# Patient Record
Sex: Male | Born: 1970
Health system: Southern US, Community
[De-identification: ages and names within clinical notes are randomized; demographics above are authoritative.]

## PROBLEM LIST (undated history)

## (undated) DIAGNOSIS — E785 Hyperlipidemia, unspecified: Secondary | ICD-10-CM

## (undated) DIAGNOSIS — I10 Essential (primary) hypertension: Secondary | ICD-10-CM

## (undated) HISTORY — DX: Hyperlipidemia, unspecified: E78.5

## (undated) HISTORY — PX: KNEE SURGERY: SHX244

## (undated) HISTORY — PX: CORONARY ANGIOPLASTY WITH STENT PLACEMENT: SHX49

---

## 2009-05-07 ENCOUNTER — Emergency Department (HOSPITAL_BASED_OUTPATIENT_CLINIC_OR_DEPARTMENT_OTHER): Admission: EM | Admit: 2009-05-07 | Discharge: 2009-05-07 | Payer: Self-pay | Admitting: Emergency Medicine

## 2009-05-07 ENCOUNTER — Ambulatory Visit: Payer: Self-pay | Admitting: Diagnostic Radiology

## 2016-08-03 ENCOUNTER — Emergency Department (INDEPENDENT_AMBULATORY_CARE_PROVIDER_SITE_OTHER): Payer: BLUE CROSS/BLUE SHIELD

## 2016-08-03 ENCOUNTER — Emergency Department
Admission: EM | Admit: 2016-08-03 | Discharge: 2016-08-03 | Disposition: A | Discharge status: Home / Self Care | Payer: BLUE CROSS/BLUE SHIELD | Source: Home / Self Care | Attending: Family Medicine | Admitting: Family Medicine

## 2016-08-03 ENCOUNTER — Encounter: Payer: Self-pay | Admitting: Emergency Medicine

## 2016-08-03 DIAGNOSIS — M2341 Loose body in knee, right knee: Secondary | ICD-10-CM

## 2016-08-03 DIAGNOSIS — S83411A Sprain of medial collateral ligament of right knee, initial encounter: Secondary | ICD-10-CM

## 2016-08-03 DIAGNOSIS — M1711 Unilateral primary osteoarthritis, right knee: Secondary | ICD-10-CM | POA: Diagnosis not present

## 2016-08-03 HISTORY — DX: Essential (primary) hypertension: I10

## 2016-08-03 MED ORDER — MELOXICAM 15 MG PO TABS: 15.0000 mg | ORAL_TABLET | Freq: Every day | ORAL | 0 refills | Status: DC

## 2016-08-03 NOTE — ED Provider Notes (Signed)
[facility]    CSN: [id number] Arrival date & time: 08/03/16  0815     History   Chief Complaint Chief Complaint  Patient presents with  . Knee Pain    HPI George Poole is a 45 y.o. male.   Patient was walking yesterday when he heard a popping noise in his right knee followed by pain that has persisted.  He began wearing a hinged knee brace that he already had.  He has a past history of severe knee strain when he was 16, and arthroscopic ACL and meniscus repair about 5 years ago.  He also reports that he had a metallic foreign body in the medial aspect of his right knee recently    The history is provided by the patient.  Knee Pain  Location:  Knee Time since incident:  1 day Injury: no   Knee location:  R knee Pain details:    Quality:  Aching   Radiates to:  Does not radiate   Severity:  Moderate   Onset quality:  Sudden   Duration:  1 day   Timing:  Constant   Progression:  Unchanged Chronicity:  New Foreign body present:  No foreign bodies Prior injury to area:  Yes Relieved by:  Nothing Worsened by:  Bearing weight Ineffective treatments: knee brace. Associated symptoms: decreased ROM, stiffness and swelling   Associated symptoms: no back pain, no fever, no muscle weakness, no numbness and no tingling     Past Medical History:  Diagnosis Date  . Hypertension     There are no active problems to display for this patient.   Past Surgical History:  Procedure Laterality Date  . KNEE SURGERY         Home Medications    Prior to Admission medications   Medication Sig Start Date End Date Taking? Authorizing Provider  lisinopril (PRINIVIL,ZESTRIL) 10 MG tablet Take 10 mg by mouth daily.   Yes Historical Provider, MD  pseudoephedrine-acetaminophen (TYLENOL SINUS) 30-500 MG TABS tablet Take 1 tablet by mouth every 4 (four) hours as needed.   Yes Historical Provider, MD  simvastatin (ZOCOR) 10 MG tablet Take 10 mg by mouth daily.   Yes  Historical Provider, MD  meloxicam (MOBIC) 15 MG tablet Take 1 tablet (15 mg total) by mouth daily. Take with food each morning 08/03/16   Lattie Haw, MD    Family History History reviewed. No pertinent family history.  Social History Social History  Substance Use Topics  . Smoking status: Never Smoker  . Smokeless tobacco: Never Used  . Alcohol use No     Allergies   Patient has no allergy information on record.   Review of Systems Review of Systems  Constitutional: Negative for fever.  Musculoskeletal: Positive for stiffness. Negative for back pain.  All other systems reviewed and are negative.    Physical Exam Triage Vital Signs ED Triage Vitals  Enc Vitals Group     BP 08/03/16 0853 147/90     Pulse Rate 08/03/16 0853 64     Resp --      Temp 08/03/16 0853 98.1 F (36.7 C)     Temp Source 08/03/16 0853 Oral     SpO2 08/03/16 0853 96 %     Weight 08/03/16 0854 228 lb (103.4 kg)     Height --      Head Circumference --      Peak Flow --      Pain Score 08/03/16 0856 8  Pain Loc --      Pain Edu? --      Excl. in GC? --    No data found.   Updated Vital Signs BP 147/90 (BP Location: Right Arm)   Pulse 64   Temp 98.1 F (36.7 C) (Oral)   Wt 228 lb (103.4 kg)   SpO2 96%   Visual Acuity Right Eye Distance:   Left Eye Distance:   Bilateral Distance:    Right Eye Near:   Left Eye Near:    Bilateral Near:     Physical Exam  Constitutional: He appears well-developed and well-nourished. No distress.  HENT:  Head: Atraumatic.  Eyes: Pupils are equal, round, and reactive to light.  Neck: Normal range of motion.  Cardiovascular: Normal rate.   Pulmonary/Chest: Effort normal.  Musculoskeletal:       Right knee: He exhibits decreased range of motion. He exhibits no swelling, no effusion, no ecchymosis, no deformity, no laceration, no erythema and no LCL laxity. Tenderness found. Medial joint line tenderness noted.       Legs: Right knee has  full range of motion.  Vague tenderness to palpation medial joint line.  Knee stable.  Negative McMurray, grind test, and Thessaly test.  Neurological: He is alert.  Skin: Skin is warm and dry.  Nursing note and vitals reviewed.    UC Treatments / Results  Labs (all labs ordered are listed, but only abnormal results are displayed) Labs Reviewed - No data to display  EKG  EKG Interpretation None       Radiology Dg Knee Complete 4 Views Right  Result Date: 08/03/2016 CLINICAL DATA:  45 year old male with right knee pain localizing to the medial compartment. Patient has a history of prior ACL surgery as well as a known metallic fragment in the soft tissues. EXAM: RIGHT KNEE - COMPLETE 4+ VIEW COMPARISON:  None. FINDINGS: There is no evidence of acute fracture, malalignment or joint effusion. Surgical changes consistent with prior ACL repair are noted. A 1.2 cm metallic radiopaque foreign body is noted within the superficial subcutaneous fat of the medial distal thigh. The foreign body is approximately 6 mm deep to the skin surface. Tricompartmental osteoarthritis is present most significant in the medial compartment were there is joint space narrowing, subchondral sclerosis and osteophyte formation. Osteophyte formation is also present in the lateral and patellofemoral compartments. Normal bony mineralization. Soft tissues are otherwise unremarkable. IMPRESSION: 1. No acute fracture, malalignment or knee joint effusion. 2. Tricompartmental degenerative osteoarthritis most significant in the medial compartment. 3. Surgical changes of prior ACL reconstruction. 4. 1.2 cm metallic foreign body in the superficial subcutaneous fat of the medial distal thigh. Electronically Signed   By: Malachy MoanHeath  McCullough M.D.   On: 08/03/2016 09:33    Procedures Procedures (including critical care time)  Medications Ordered in UC Medications - No data to display   Initial Impression / Assessment and Plan / UC  Course  I have reviewed the triage vital signs and the nursing notes.  Pertinent labs & imaging results that were available during my care of the patient were reviewed by me and considered in my medical decision making (see chart for details).  Clinical Course   Begin Mobic 15mg  daily. Continue knee brace daytime.  Apply ice pack for 20 to 30 minutes, 3 to 4 times daily  Continue until pain and swelling decrease. Begin knee range of motion and stretching exercises as tolerated. Followup with Dr. Rodney Langtonhomas Thekkekandam or Dr. Clementeen GrahamEvan Corey ([facility]) if not improving about two weeks.      Final Clinical Impressions(s) / UC Diagnoses   Final diagnoses:  Sprain of medial collateral ligament of right knee, initial encounter    New Prescriptions New Prescriptions   MELOXICAM (MOBIC) 15 MG TABLET    Take 1 tablet (15 mg total) by mouth daily. Take with food each morning     Lattie HawStephen A Beese, MD 08/19/16 1013

## 2016-08-03 NOTE — Discharge Instructions (Signed)
Continue knee brace daytime.  Apply ice pack for 20 to 30 minutes, 3 to 4 times daily  Continue until pain and swelling decrease. Begin knee range of motion and stretching exercises as tolerated.

## 2016-08-03 NOTE — ED Triage Notes (Signed)
Pt c/o right knee pain. States he heard a popping noise yesterday while walking. He had ACL repair in 2011 and states he has reoccurring knee pain.

## 2018-05-12 DIAGNOSIS — M7989 Other specified soft tissue disorders: Secondary | ICD-10-CM | POA: Diagnosis not present

## 2018-05-12 DIAGNOSIS — E7800 Pure hypercholesterolemia, unspecified: Secondary | ICD-10-CM | POA: Diagnosis not present

## 2018-05-12 DIAGNOSIS — I10 Essential (primary) hypertension: Secondary | ICD-10-CM | POA: Diagnosis not present

## 2018-05-12 DIAGNOSIS — M79604 Pain in right leg: Secondary | ICD-10-CM | POA: Diagnosis not present

## 2018-05-12 DIAGNOSIS — Z125 Encounter for screening for malignant neoplasm of prostate: Secondary | ICD-10-CM | POA: Diagnosis not present

## 2018-05-15 ENCOUNTER — Ambulatory Visit: Payer: BLUE CROSS/BLUE SHIELD | Admitting: Sports Medicine

## 2018-05-15 ENCOUNTER — Encounter: Payer: Self-pay | Admitting: Sports Medicine

## 2018-05-15 DIAGNOSIS — M1711 Unilateral primary osteoarthritis, right knee: Secondary | ICD-10-CM | POA: Diagnosis not present

## 2018-05-15 DIAGNOSIS — M79661 Pain in right lower leg: Secondary | ICD-10-CM | POA: Insufficient documentation

## 2018-05-15 MED ORDER — MELOXICAM 15 MG PO TABS: ORAL_TABLET | ORAL | 3 refills | Status: DC

## 2018-05-15 NOTE — Assessment & Plan Note (Signed)
Status post ACL reconstruction with tricompartment osteoarthritis as expected. Likely some degenerative meniscal tearing as well. Meloxicam, knee rehab exercises. Return in 1 month, injection if no better.

## 2018-05-15 NOTE — Progress Notes (Signed)
Subjective:    I'm seeing this patient as a consultation for: Dr. Roxy Manns Radiontchenko  CC: Right leg pain  HPI: This is a pleasant 47 year old male, he is post ACL reconstruction on the right years ago.  More recently when getting out of a truck he hyperextended his right knee, felt pain in the calf, he was seen by his PCP, appropriately a DVT ultrasound was ordered and was negative.  He also had x-rays done that showed tricompartmental osteoarthritis.  He is referred to me for further evaluation and definitive treatment.  Pain is moderate, persistent, localized at the musculotendinous junction of the medial head of the gastrocnemius, minimal pain at the medial joint line of the knee but this is not concordant.  No mechanical symptoms.  I reviewed the past medical history, family history, social history, surgical history, and allergies today and no changes were needed.  Please see the problem list section below in epic for further details.  Past Medical History: Past Medical History:  Diagnosis Date  . Hyperlipidemia   . Hypertension    Past Surgical History: Past Surgical History:  Procedure Laterality Date  . CORONARY ANGIOPLASTY WITH STENT PLACEMENT    . KNEE SURGERY     Social History: Social History   Socioeconomic History  . Marital status: Married    Spouse name: Not on file  . Number of children: Not on file  . Years of education: Not on file  . Highest education level: Not on file  Occupational History  . Not on file  Social Needs  . Financial resource strain: Not on file  . Food insecurity:    Worry: Not on file    Inability: Not on file  . Transportation needs:    Medical: Not on file    Non-medical: Not on file  Tobacco Use  . Smoking status: Never Smoker  . Smokeless tobacco: Never Used  Substance and Sexual Activity  . Alcohol use: Yes  . Drug use: No  . Sexual activity: Not on file  Lifestyle  . Physical activity:    Days per week: Not on file    Minutes per session: Not on file  . Stress: Not on file  Relationships  . Social connections:    Talks on phone: Not on file    Gets together: Not on file    Attends religious service: Not on file    Active member of club or organization: Not on file    Attends meetings of clubs or organizations: Not on file    Relationship status: Not on file  Other Topics Concern  . Not on file  Social History Narrative  . Not on file   Family History: No family history on file. Allergies: No Known Allergies Medications: See med rec.  Review of Systems: No headache, visual changes, nausea, vomiting, diarrhea, constipation, dizziness, abdominal pain, skin rash, fevers, chills, night sweats, weight loss, swollen lymph nodes, body aches, joint swelling, muscle aches, chest pain, shortness of breath, mood changes, visual or auditory hallucinations.   Objective:   General: Well Developed, well nourished, and in no acute distress.  Neuro:  Extra-ocular muscles intact, able to move all 4 extremities, sensation grossly intact.  Deep tendon reflexes tested were normal. Psych: Alert and oriented, mood congruent with affect. ENT:  Ears and nose appear unremarkable.  Hearing grossly normal. Neck: Unremarkable overall appearance, trachea midline.  No visible thyroid enlargement. Eyes: Conjunctivae and lids appear unremarkable.  Pupils equal and round. Skin:  Warm and dry, no rashes noted.  Cardiovascular: Pulses palpable, no extremity edema. Right knee: Normal to inspection with no erythema or effusion or obvious bony abnormalities. Minimal tenderness at the medial joint line. ROM normal in flexion and extension and lower leg rotation. Ligaments with solid consistent endpoints including ACL, PCL, LCL, MCL. Negative Mcmurray's and provocative meniscal tests. Non painful patellar compression. Patellar and quadriceps tendons unremarkable. Hamstring and quadriceps strength is normal. Tenderness to palpation  at the musculotendinous junction of the medial head of the gastrocnemius.  The calf and ankle were strapped with a compressive dressing.  Impression and Recommendations:   This case required medical decision making of moderate complexity.  Right calf pain This is the primary issue. Strap With compressive dressing, bilateral heel lifts. Calf rehab exercises given. Meloxicam for pain. PCP appropriately performed DVT ultrasound that was negative. Return to see me in 4 weeks.  Primary osteoarthritis of right knee Status post ACL reconstruction with tricompartment osteoarthritis as expected. Likely some degenerative meniscal tearing as well. Meloxicam, knee rehab exercises. Return in 1 month, injection if no better. ___________________________________________ Ihor Austin. Benjamin Stain, M.D., ABFM., CAQSM. Primary Care and Sports Medicine [facility]  Adjunct Instructor of Family Medicine  [employer]

## 2018-05-15 NOTE — Assessment & Plan Note (Signed)
This is the primary issue. Strap With compressive dressing, bilateral heel lifts. Calf rehab exercises given. Meloxicam for pain. PCP appropriately performed DVT ultrasound that was negative. Return to see me in 4 weeks.

## 2018-06-13 ENCOUNTER — Ambulatory Visit: Payer: BLUE CROSS/BLUE SHIELD | Admitting: Sports Medicine

## 2018-08-07 DIAGNOSIS — H5203 Hypermetropia, bilateral: Secondary | ICD-10-CM | POA: Diagnosis not present

## 2018-12-15 DIAGNOSIS — I10 Essential (primary) hypertension: Secondary | ICD-10-CM | POA: Diagnosis not present

## 2018-12-15 DIAGNOSIS — I2510 Atherosclerotic heart disease of native coronary artery without angina pectoris: Secondary | ICD-10-CM | POA: Diagnosis not present

## 2018-12-15 DIAGNOSIS — E7800 Pure hypercholesterolemia, unspecified: Secondary | ICD-10-CM | POA: Diagnosis not present

## 2019-07-08 DIAGNOSIS — I2510 Atherosclerotic heart disease of native coronary artery without angina pectoris: Secondary | ICD-10-CM | POA: Diagnosis not present

## 2019-07-08 DIAGNOSIS — I10 Essential (primary) hypertension: Secondary | ICD-10-CM | POA: Diagnosis not present

## 2019-07-08 DIAGNOSIS — E7800 Pure hypercholesterolemia, unspecified: Secondary | ICD-10-CM | POA: Diagnosis not present

## 2019-08-03 DIAGNOSIS — H5203 Hypermetropia, bilateral: Secondary | ICD-10-CM | POA: Diagnosis not present

## 2019-08-13 DIAGNOSIS — E7800 Pure hypercholesterolemia, unspecified: Secondary | ICD-10-CM | POA: Diagnosis not present

## 2019-08-13 DIAGNOSIS — I10 Essential (primary) hypertension: Secondary | ICD-10-CM | POA: Diagnosis not present

## 2019-08-13 DIAGNOSIS — I2510 Atherosclerotic heart disease of native coronary artery without angina pectoris: Secondary | ICD-10-CM | POA: Diagnosis not present

## 2019-08-25 DIAGNOSIS — Z20822 Contact with and (suspected) exposure to covid-19: Secondary | ICD-10-CM | POA: Diagnosis not present

## 2019-08-27 DIAGNOSIS — Z20828 Contact with and (suspected) exposure to other viral communicable diseases: Secondary | ICD-10-CM | POA: Diagnosis not present

## 2019-10-16 ENCOUNTER — Other Ambulatory Visit: Payer: Self-pay

## 2019-10-16 ENCOUNTER — Ambulatory Visit: Payer: BC Managed Care – PPO | Admitting: Sports Medicine

## 2019-10-16 ENCOUNTER — Ambulatory Visit (INDEPENDENT_AMBULATORY_CARE_PROVIDER_SITE_OTHER): Payer: BC Managed Care – PPO

## 2019-10-16 ENCOUNTER — Encounter: Payer: Self-pay | Admitting: Sports Medicine

## 2019-10-16 DIAGNOSIS — M25521 Pain in right elbow: Secondary | ICD-10-CM | POA: Diagnosis not present

## 2019-10-16 DIAGNOSIS — M778 Other enthesopathies, not elsewhere classified: Secondary | ICD-10-CM

## 2019-10-16 DIAGNOSIS — R600 Localized edema: Secondary | ICD-10-CM | POA: Diagnosis not present

## 2019-10-16 MED ORDER — MELOXICAM 15 MG PO TABS: ORAL_TABLET | ORAL | 3 refills | Status: AC

## 2019-10-16 NOTE — Progress Notes (Signed)
    Procedures performed today:    None.  Independent interpretation of notes and tests performed by another provider:   None.  Impression and Recommendations:    Tendinitis of right triceps Pleasant 49 year old George Poole, right-handed, woke up with severe pain at the olecranon, worse with extension of the elbow. No trauma. Clinically this resembles a triceps tendinitis, no evidence of olecranon bursitis, reproduction of pain with resisted extension of the elbow. X-rays, elbow sleeve, meloxicam. He is unable to do light duty at work, return to see me in 2 weeks, if persistent pain we will consider left-handed duty only.    ___________________________________________ George Poole. Benjamin Stain, M.D., ABFM., CAQSM. Primary Care and Sports Medicine [facility]  Adjunct Instructor of Family Medicine  [employer]

## 2019-10-16 NOTE — Assessment & Plan Note (Signed)
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 No trauma. Clinically this resembles a triceps tendinitis, no evidence of olecranon bursitis, reproduction of pain with resisted extension of the elbow. X-rays, elbow sleeve, meloxicam. He is unable to do light duty at work, return to see me in 2 weeks, if persistent pain we will consider left-handed duty only.

## 2019-11-10 ENCOUNTER — Ambulatory Visit: Payer: BC Managed Care – PPO | Admitting: Sports Medicine

## 2020-01-31 DIAGNOSIS — Z1159 Encounter for screening for other viral diseases: Secondary | ICD-10-CM | POA: Diagnosis not present

## 2020-03-05 DIAGNOSIS — Z1159 Encounter for screening for other viral diseases: Secondary | ICD-10-CM | POA: Diagnosis not present

## 2020-08-04 DIAGNOSIS — I2510 Atherosclerotic heart disease of native coronary artery without angina pectoris: Secondary | ICD-10-CM | POA: Diagnosis not present

## 2020-08-08 DIAGNOSIS — M1711 Unilateral primary osteoarthritis, right knee: Secondary | ICD-10-CM | POA: Diagnosis not present

## 2020-08-08 DIAGNOSIS — M25562 Pain in left knee: Secondary | ICD-10-CM | POA: Diagnosis not present

## 2020-08-08 DIAGNOSIS — E669 Obesity, unspecified: Secondary | ICD-10-CM | POA: Diagnosis not present

## 2020-08-08 DIAGNOSIS — M25561 Pain in right knee: Secondary | ICD-10-CM | POA: Diagnosis not present

## 2020-08-08 DIAGNOSIS — M94262 Chondromalacia, left knee: Secondary | ICD-10-CM | POA: Diagnosis not present

## 2020-09-29 DIAGNOSIS — I2510 Atherosclerotic heart disease of native coronary artery without angina pectoris: Secondary | ICD-10-CM | POA: Diagnosis not present

## 2020-09-29 DIAGNOSIS — I10 Essential (primary) hypertension: Secondary | ICD-10-CM | POA: Diagnosis not present

## 2020-09-29 DIAGNOSIS — E7800 Pure hypercholesterolemia, unspecified: Secondary | ICD-10-CM | POA: Diagnosis not present

## 2021-05-09 DIAGNOSIS — M25561 Pain in right knee: Secondary | ICD-10-CM | POA: Diagnosis not present

## 2021-05-09 DIAGNOSIS — M1711 Unilateral primary osteoarthritis, right knee: Secondary | ICD-10-CM | POA: Diagnosis not present

## 2021-05-15 DIAGNOSIS — M25561 Pain in right knee: Secondary | ICD-10-CM | POA: Diagnosis not present

## 2021-05-16 DIAGNOSIS — M1711 Unilateral primary osteoarthritis, right knee: Secondary | ICD-10-CM | POA: Diagnosis not present

## 2021-05-17 DIAGNOSIS — M25561 Pain in right knee: Secondary | ICD-10-CM | POA: Diagnosis not present

## 2021-05-17 DIAGNOSIS — M1711 Unilateral primary osteoarthritis, right knee: Secondary | ICD-10-CM | POA: Diagnosis not present

## 2021-05-17 DIAGNOSIS — R6889 Other general symptoms and signs: Secondary | ICD-10-CM | POA: Diagnosis not present

## 2021-05-19 DIAGNOSIS — M25561 Pain in right knee: Secondary | ICD-10-CM | POA: Diagnosis not present

## 2021-05-19 DIAGNOSIS — Z01818 Encounter for other preprocedural examination: Secondary | ICD-10-CM | POA: Diagnosis not present

## 2021-05-19 DIAGNOSIS — M1711 Unilateral primary osteoarthritis, right knee: Secondary | ICD-10-CM | POA: Diagnosis not present

## 2021-05-19 DIAGNOSIS — M25461 Effusion, right knee: Secondary | ICD-10-CM | POA: Diagnosis not present

## 2021-05-22 DIAGNOSIS — M25761 Osteophyte, right knee: Secondary | ICD-10-CM | POA: Diagnosis not present

## 2021-05-22 DIAGNOSIS — G8918 Other acute postprocedural pain: Secondary | ICD-10-CM | POA: Diagnosis not present

## 2021-05-22 DIAGNOSIS — M1711 Unilateral primary osteoarthritis, right knee: Secondary | ICD-10-CM | POA: Diagnosis not present

## 2021-05-24 DIAGNOSIS — R6889 Other general symptoms and signs: Secondary | ICD-10-CM | POA: Diagnosis not present

## 2021-05-24 DIAGNOSIS — M25561 Pain in right knee: Secondary | ICD-10-CM | POA: Diagnosis not present

## 2021-05-29 DIAGNOSIS — M25561 Pain in right knee: Secondary | ICD-10-CM | POA: Diagnosis not present

## 2021-05-29 DIAGNOSIS — R6889 Other general symptoms and signs: Secondary | ICD-10-CM | POA: Diagnosis not present

## 2021-06-01 DIAGNOSIS — R6889 Other general symptoms and signs: Secondary | ICD-10-CM | POA: Diagnosis not present

## 2021-06-01 DIAGNOSIS — M25561 Pain in right knee: Secondary | ICD-10-CM | POA: Diagnosis not present

## 2021-06-06 DIAGNOSIS — R6889 Other general symptoms and signs: Secondary | ICD-10-CM | POA: Diagnosis not present

## 2021-06-06 DIAGNOSIS — M25561 Pain in right knee: Secondary | ICD-10-CM | POA: Diagnosis not present

## 2021-06-08 DIAGNOSIS — M25561 Pain in right knee: Secondary | ICD-10-CM | POA: Diagnosis not present

## 2021-06-08 DIAGNOSIS — R6889 Other general symptoms and signs: Secondary | ICD-10-CM | POA: Diagnosis not present

## 2021-06-13 DIAGNOSIS — M25561 Pain in right knee: Secondary | ICD-10-CM | POA: Diagnosis not present

## 2021-06-13 DIAGNOSIS — R6889 Other general symptoms and signs: Secondary | ICD-10-CM | POA: Diagnosis not present

## 2021-06-21 DIAGNOSIS — R6889 Other general symptoms and signs: Secondary | ICD-10-CM | POA: Diagnosis not present

## 2021-06-21 DIAGNOSIS — M25561 Pain in right knee: Secondary | ICD-10-CM | POA: Diagnosis not present

## 2021-06-23 DIAGNOSIS — M1711 Unilateral primary osteoarthritis, right knee: Secondary | ICD-10-CM | POA: Diagnosis not present

## 2021-07-30 IMAGING — DX DG ELBOW COMPLETE 3+V*R*
4 series · 4 of 4 positions shown · non-contrast
Comparison: None.

CLINICAL DATA: Olecranon pain

EXAM:
RIGHT ELBOW - COMPLETE 3+ VIEW

[elbow ap]
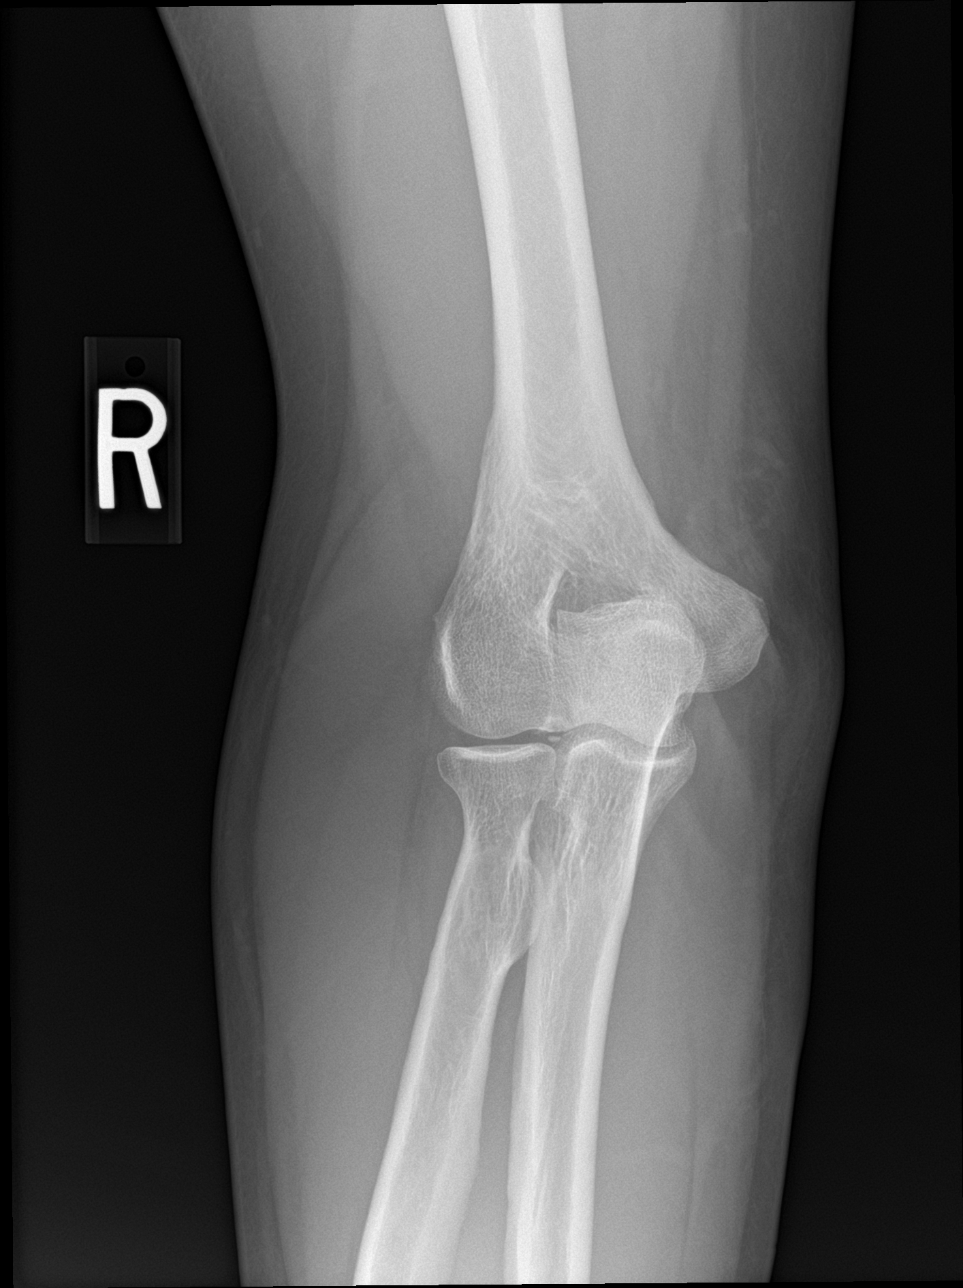

[elbow obl (1 of 2)]
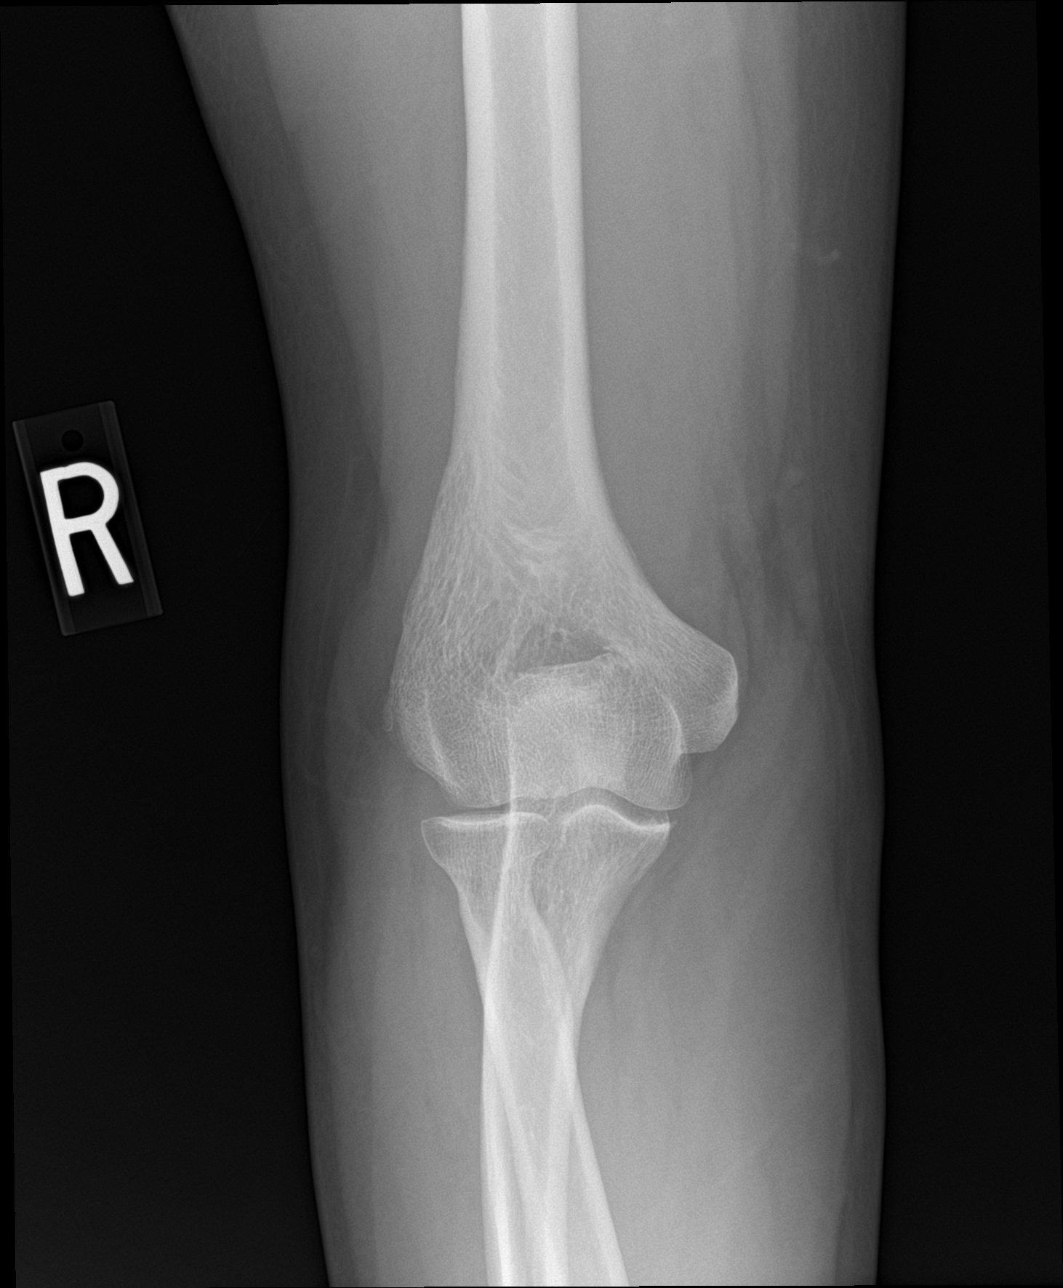

[elbow obl (2 of 2)]
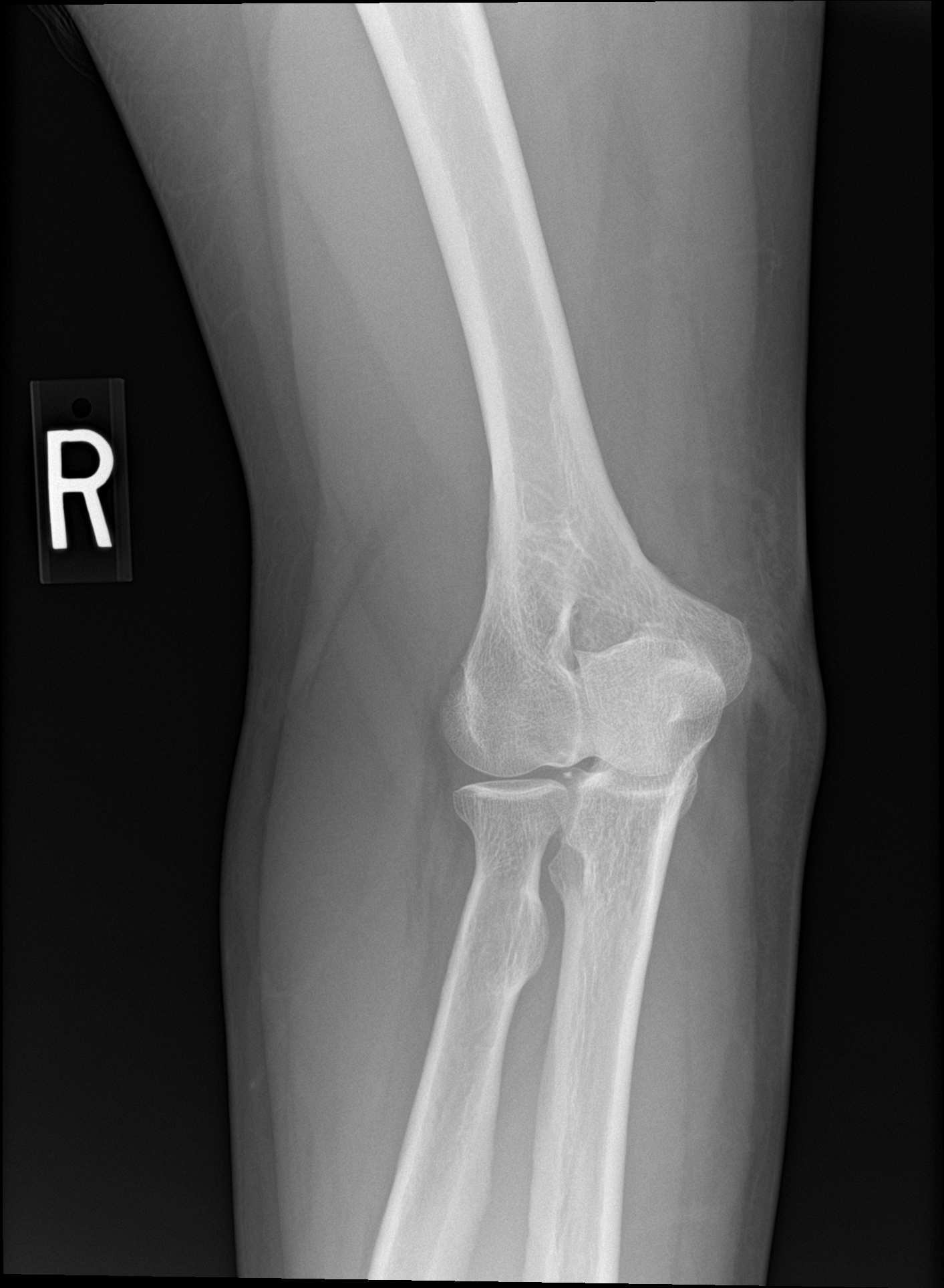

[elbow lat]
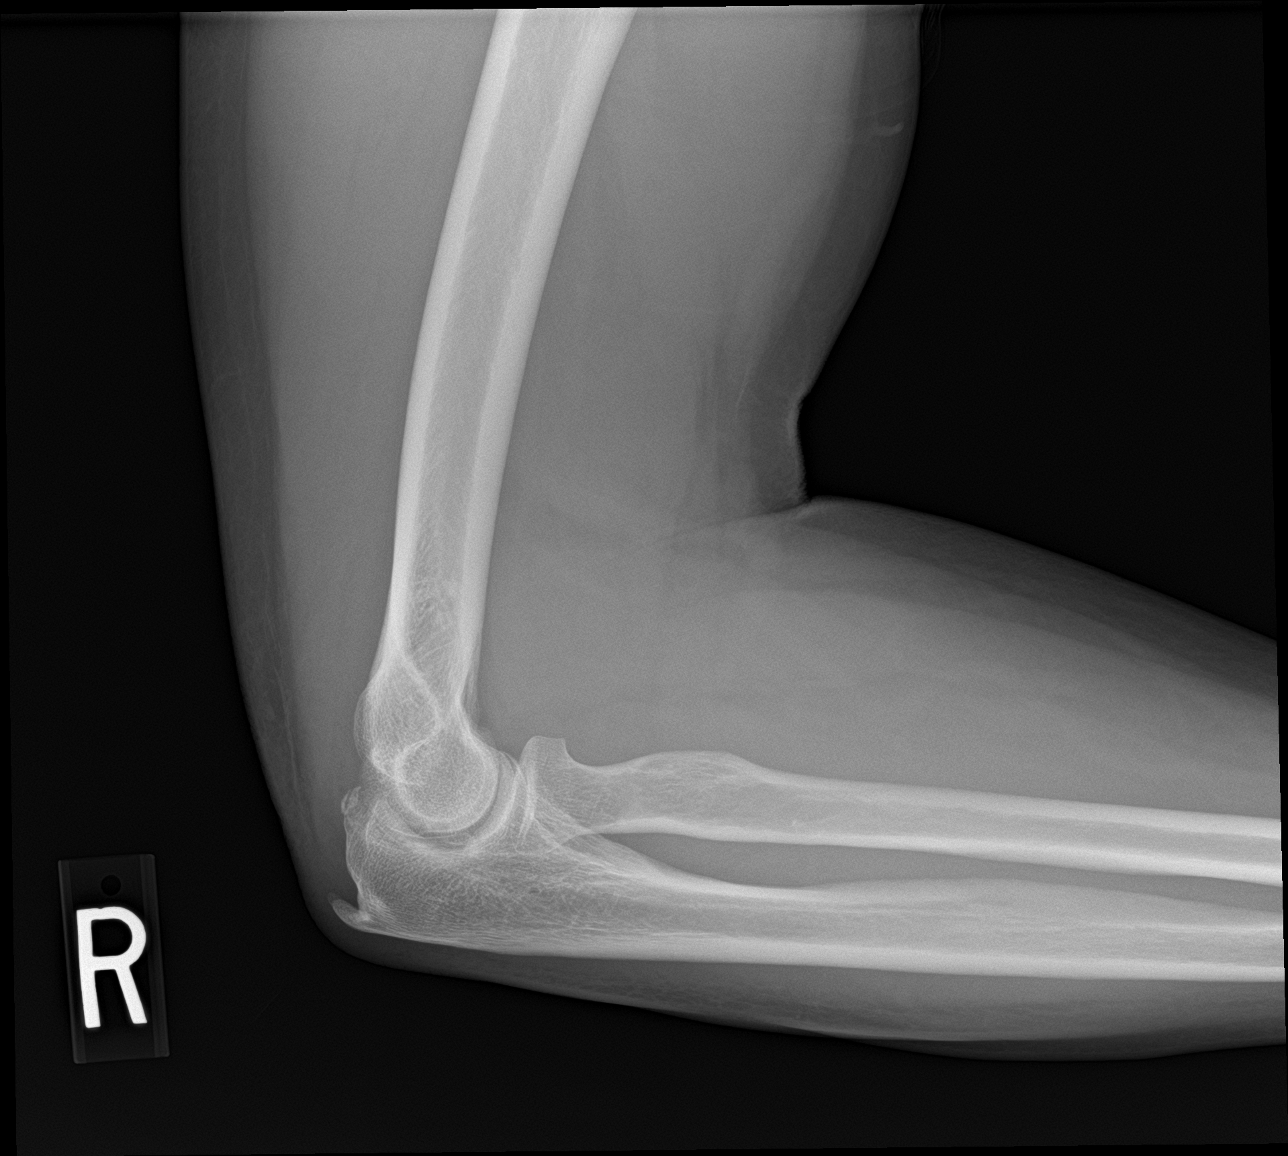

[4 of 4 positions shown; findings below may reference images not displayed]

FINDINGS: No fracture or dislocation of the right elbow. Joint spaces are
preserved. No elbow joint effusion. There is bony enthesopathic
change of the olecranon. Mild soft tissue edema over the olecranon.
IMPRESSION: 1. Bony enthesopathic change of the olecranon, which may be
symptomatic.

2. Mild soft tissue edema over the olecranon.

3.  No fracture or dislocation of the right elbow.

## 2021-07-31 DIAGNOSIS — H52203 Unspecified astigmatism, bilateral: Secondary | ICD-10-CM | POA: Diagnosis not present

## 2021-07-31 DIAGNOSIS — H02834 Dermatochalasis of left upper eyelid: Secondary | ICD-10-CM | POA: Diagnosis not present

## 2021-07-31 DIAGNOSIS — H25813 Combined forms of age-related cataract, bilateral: Secondary | ICD-10-CM | POA: Diagnosis not present

## 2021-07-31 DIAGNOSIS — H02831 Dermatochalasis of right upper eyelid: Secondary | ICD-10-CM | POA: Diagnosis not present

## 2021-08-09 DIAGNOSIS — Z0000 Encounter for general adult medical examination without abnormal findings: Secondary | ICD-10-CM | POA: Diagnosis not present

## 2021-08-09 DIAGNOSIS — Z23 Encounter for immunization: Secondary | ICD-10-CM | POA: Diagnosis not present

## 2021-08-09 DIAGNOSIS — E785 Hyperlipidemia, unspecified: Secondary | ICD-10-CM | POA: Diagnosis not present

## 2021-08-09 DIAGNOSIS — I2510 Atherosclerotic heart disease of native coronary artery without angina pectoris: Secondary | ICD-10-CM | POA: Diagnosis not present

## 2021-08-09 DIAGNOSIS — Z1211 Encounter for screening for malignant neoplasm of colon: Secondary | ICD-10-CM | POA: Diagnosis not present

## 2021-08-09 DIAGNOSIS — I10 Essential (primary) hypertension: Secondary | ICD-10-CM | POA: Diagnosis not present

## 2021-08-09 DIAGNOSIS — Z2821 Immunization not carried out because of patient refusal: Secondary | ICD-10-CM | POA: Diagnosis not present

## 2021-08-23 DIAGNOSIS — E785 Hyperlipidemia, unspecified: Secondary | ICD-10-CM | POA: Diagnosis not present

## 2021-08-23 DIAGNOSIS — I10 Essential (primary) hypertension: Secondary | ICD-10-CM | POA: Diagnosis not present

## 2021-08-23 DIAGNOSIS — Z125 Encounter for screening for malignant neoplasm of prostate: Secondary | ICD-10-CM | POA: Diagnosis not present

## 2021-08-31 DIAGNOSIS — H52203 Unspecified astigmatism, bilateral: Secondary | ICD-10-CM | POA: Diagnosis not present

## 2021-08-31 DIAGNOSIS — H25813 Combined forms of age-related cataract, bilateral: Secondary | ICD-10-CM | POA: Diagnosis not present

## 2021-09-07 DIAGNOSIS — Z79899 Other long term (current) drug therapy: Secondary | ICD-10-CM | POA: Diagnosis not present

## 2021-09-07 DIAGNOSIS — H52222 Regular astigmatism, left eye: Secondary | ICD-10-CM | POA: Diagnosis not present

## 2021-09-07 DIAGNOSIS — I10 Essential (primary) hypertension: Secondary | ICD-10-CM | POA: Diagnosis not present

## 2021-09-07 DIAGNOSIS — H02831 Dermatochalasis of right upper eyelid: Secondary | ICD-10-CM | POA: Diagnosis not present

## 2021-09-07 DIAGNOSIS — E785 Hyperlipidemia, unspecified: Secondary | ICD-10-CM | POA: Diagnosis not present

## 2021-09-07 DIAGNOSIS — H25813 Combined forms of age-related cataract, bilateral: Secondary | ICD-10-CM | POA: Diagnosis not present

## 2021-09-07 DIAGNOSIS — H52223 Regular astigmatism, bilateral: Secondary | ICD-10-CM | POA: Diagnosis not present

## 2021-09-07 DIAGNOSIS — Z7982 Long term (current) use of aspirin: Secondary | ICD-10-CM | POA: Diagnosis not present

## 2021-09-07 DIAGNOSIS — H25812 Combined forms of age-related cataract, left eye: Secondary | ICD-10-CM | POA: Diagnosis not present

## 2021-09-07 DIAGNOSIS — H02834 Dermatochalasis of left upper eyelid: Secondary | ICD-10-CM | POA: Diagnosis not present

## 2021-09-07 DIAGNOSIS — Z955 Presence of coronary angioplasty implant and graft: Secondary | ICD-10-CM | POA: Diagnosis not present

## 2021-09-07 DIAGNOSIS — I2510 Atherosclerotic heart disease of native coronary artery without angina pectoris: Secondary | ICD-10-CM | POA: Diagnosis not present

## 2021-09-07 DIAGNOSIS — Z951 Presence of aortocoronary bypass graft: Secondary | ICD-10-CM | POA: Diagnosis not present

## 2021-09-07 DIAGNOSIS — Z96651 Presence of right artificial knee joint: Secondary | ICD-10-CM | POA: Diagnosis not present

## 2021-09-25 DIAGNOSIS — R21 Rash and other nonspecific skin eruption: Secondary | ICD-10-CM | POA: Diagnosis not present

## 2021-09-25 DIAGNOSIS — I2510 Atherosclerotic heart disease of native coronary artery without angina pectoris: Secondary | ICD-10-CM | POA: Diagnosis not present

## 2021-09-25 DIAGNOSIS — I10 Essential (primary) hypertension: Secondary | ICD-10-CM | POA: Diagnosis not present

## 2021-10-12 DIAGNOSIS — E7800 Pure hypercholesterolemia, unspecified: Secondary | ICD-10-CM | POA: Diagnosis not present

## 2021-10-12 DIAGNOSIS — I10 Essential (primary) hypertension: Secondary | ICD-10-CM | POA: Diagnosis not present

## 2021-10-12 DIAGNOSIS — I2510 Atherosclerotic heart disease of native coronary artery without angina pectoris: Secondary | ICD-10-CM | POA: Diagnosis not present

## 2021-10-23 DIAGNOSIS — I10 Essential (primary) hypertension: Secondary | ICD-10-CM | POA: Diagnosis not present

## 2021-10-23 DIAGNOSIS — I2510 Atherosclerotic heart disease of native coronary artery without angina pectoris: Secondary | ICD-10-CM | POA: Diagnosis not present

## 2021-10-23 DIAGNOSIS — Z23 Encounter for immunization: Secondary | ICD-10-CM | POA: Diagnosis not present

## 2021-10-23 DIAGNOSIS — Z2821 Immunization not carried out because of patient refusal: Secondary | ICD-10-CM | POA: Diagnosis not present

## 2021-11-10 DIAGNOSIS — Z1211 Encounter for screening for malignant neoplasm of colon: Secondary | ICD-10-CM | POA: Diagnosis not present

## 2021-11-10 DIAGNOSIS — D124 Benign neoplasm of descending colon: Secondary | ICD-10-CM | POA: Diagnosis not present

## 2021-11-10 DIAGNOSIS — E7800 Pure hypercholesterolemia, unspecified: Secondary | ICD-10-CM | POA: Diagnosis not present

## 2021-11-10 DIAGNOSIS — D128 Benign neoplasm of rectum: Secondary | ICD-10-CM | POA: Diagnosis not present

## 2021-11-10 DIAGNOSIS — D123 Benign neoplasm of transverse colon: Secondary | ICD-10-CM | POA: Diagnosis not present

## 2022-01-22 DIAGNOSIS — I2510 Atherosclerotic heart disease of native coronary artery without angina pectoris: Secondary | ICD-10-CM | POA: Diagnosis not present

## 2022-02-05 DIAGNOSIS — I10 Essential (primary) hypertension: Secondary | ICD-10-CM | POA: Diagnosis not present

## 2022-02-05 DIAGNOSIS — I2510 Atherosclerotic heart disease of native coronary artery without angina pectoris: Secondary | ICD-10-CM | POA: Diagnosis not present

## 2022-02-05 DIAGNOSIS — E785 Hyperlipidemia, unspecified: Secondary | ICD-10-CM | POA: Diagnosis not present

## 2022-02-05 DIAGNOSIS — R600 Localized edema: Secondary | ICD-10-CM | POA: Diagnosis not present

## 2022-02-05 DIAGNOSIS — R052 Subacute cough: Secondary | ICD-10-CM | POA: Diagnosis not present

## 2022-02-05 DIAGNOSIS — I517 Cardiomegaly: Secondary | ICD-10-CM | POA: Diagnosis not present

## 2022-02-05 DIAGNOSIS — D649 Anemia, unspecified: Secondary | ICD-10-CM | POA: Diagnosis not present

## 2022-02-05 DIAGNOSIS — R918 Other nonspecific abnormal finding of lung field: Secondary | ICD-10-CM | POA: Diagnosis not present

## 2022-02-05 DIAGNOSIS — Z23 Encounter for immunization: Secondary | ICD-10-CM | POA: Diagnosis not present

## 2022-02-08 DIAGNOSIS — J189 Pneumonia, unspecified organism: Secondary | ICD-10-CM | POA: Diagnosis not present

## 2022-02-08 DIAGNOSIS — D6489 Other specified anemias: Secondary | ICD-10-CM | POA: Diagnosis not present

## 2022-02-08 DIAGNOSIS — I10 Essential (primary) hypertension: Secondary | ICD-10-CM | POA: Diagnosis not present

## 2022-02-28 DIAGNOSIS — R600 Localized edema: Secondary | ICD-10-CM | POA: Diagnosis not present

## 2022-02-28 DIAGNOSIS — I10 Essential (primary) hypertension: Secondary | ICD-10-CM | POA: Diagnosis not present

## 2022-03-01 DIAGNOSIS — R188 Other ascites: Secondary | ICD-10-CM | POA: Diagnosis not present

## 2022-03-01 DIAGNOSIS — Z79899 Other long term (current) drug therapy: Secondary | ICD-10-CM | POA: Diagnosis not present

## 2022-03-01 DIAGNOSIS — I7121 Aneurysm of the ascending aorta, without rupture: Secondary | ICD-10-CM | POA: Diagnosis not present

## 2022-03-01 DIAGNOSIS — J90 Pleural effusion, not elsewhere classified: Secondary | ICD-10-CM | POA: Diagnosis not present

## 2022-03-01 DIAGNOSIS — N179 Acute kidney failure, unspecified: Secondary | ICD-10-CM | POA: Diagnosis not present

## 2022-03-01 DIAGNOSIS — N2889 Other specified disorders of kidney and ureter: Secondary | ICD-10-CM | POA: Diagnosis not present

## 2022-03-01 DIAGNOSIS — R197 Diarrhea, unspecified: Secondary | ICD-10-CM | POA: Diagnosis not present

## 2022-03-01 DIAGNOSIS — R918 Other nonspecific abnormal finding of lung field: Secondary | ICD-10-CM | POA: Diagnosis not present

## 2022-03-01 DIAGNOSIS — D62 Acute posthemorrhagic anemia: Secondary | ICD-10-CM | POA: Diagnosis not present

## 2022-03-01 DIAGNOSIS — R0602 Shortness of breath: Secondary | ICD-10-CM | POA: Diagnosis not present

## 2022-03-01 DIAGNOSIS — Z20822 Contact with and (suspected) exposure to covid-19: Secondary | ICD-10-CM | POA: Diagnosis not present

## 2022-03-01 DIAGNOSIS — K7460 Unspecified cirrhosis of liver: Secondary | ICD-10-CM | POA: Diagnosis not present

## 2022-03-01 DIAGNOSIS — Z955 Presence of coronary angioplasty implant and graft: Secondary | ICD-10-CM | POA: Diagnosis not present

## 2022-03-01 DIAGNOSIS — I2510 Atherosclerotic heart disease of native coronary artery without angina pectoris: Secondary | ICD-10-CM | POA: Diagnosis not present

## 2022-03-01 DIAGNOSIS — I10 Essential (primary) hypertension: Secondary | ICD-10-CM | POA: Diagnosis not present

## 2022-03-01 DIAGNOSIS — Z7982 Long term (current) use of aspirin: Secondary | ICD-10-CM | POA: Diagnosis not present

## 2022-03-01 DIAGNOSIS — E785 Hyperlipidemia, unspecified: Secondary | ICD-10-CM | POA: Diagnosis not present

## 2022-03-01 DIAGNOSIS — R310 Gross hematuria: Secondary | ICD-10-CM | POA: Diagnosis not present

## 2022-03-01 DIAGNOSIS — E278 Other specified disorders of adrenal gland: Secondary | ICD-10-CM | POA: Diagnosis not present

## 2022-03-01 DIAGNOSIS — R600 Localized edema: Secondary | ICD-10-CM | POA: Diagnosis not present

## 2022-03-02 DIAGNOSIS — N2889 Other specified disorders of kidney and ureter: Secondary | ICD-10-CM | POA: Diagnosis not present

## 2022-03-02 DIAGNOSIS — N179 Acute kidney failure, unspecified: Secondary | ICD-10-CM | POA: Diagnosis not present

## 2022-03-02 DIAGNOSIS — R600 Localized edema: Secondary | ICD-10-CM | POA: Diagnosis not present

## 2022-03-02 DIAGNOSIS — R310 Gross hematuria: Secondary | ICD-10-CM | POA: Diagnosis not present

## 2022-03-02 DIAGNOSIS — D62 Acute posthemorrhagic anemia: Secondary | ICD-10-CM | POA: Diagnosis not present

## 2022-03-02 DIAGNOSIS — R197 Diarrhea, unspecified: Secondary | ICD-10-CM | POA: Diagnosis not present

## 2022-03-03 DIAGNOSIS — E278 Other specified disorders of adrenal gland: Secondary | ICD-10-CM | POA: Diagnosis not present

## 2022-03-03 DIAGNOSIS — R310 Gross hematuria: Secondary | ICD-10-CM | POA: Diagnosis not present

## 2022-03-03 DIAGNOSIS — K7460 Unspecified cirrhosis of liver: Secondary | ICD-10-CM | POA: Diagnosis not present

## 2022-03-03 DIAGNOSIS — N2889 Other specified disorders of kidney and ureter: Secondary | ICD-10-CM | POA: Diagnosis not present

## 2022-03-03 DIAGNOSIS — I080 Rheumatic disorders of both mitral and aortic valves: Secondary | ICD-10-CM | POA: Diagnosis not present

## 2022-03-03 DIAGNOSIS — D62 Acute posthemorrhagic anemia: Secondary | ICD-10-CM | POA: Diagnosis not present

## 2022-03-03 DIAGNOSIS — N179 Acute kidney failure, unspecified: Secondary | ICD-10-CM | POA: Diagnosis not present

## 2022-03-03 DIAGNOSIS — R188 Other ascites: Secondary | ICD-10-CM | POA: Diagnosis not present

## 2022-03-03 DIAGNOSIS — I77810 Thoracic aortic ectasia: Secondary | ICD-10-CM | POA: Diagnosis not present

## 2022-03-04 DIAGNOSIS — N179 Acute kidney failure, unspecified: Secondary | ICD-10-CM | POA: Diagnosis not present

## 2022-03-04 DIAGNOSIS — D62 Acute posthemorrhagic anemia: Secondary | ICD-10-CM | POA: Diagnosis not present

## 2022-03-04 DIAGNOSIS — R310 Gross hematuria: Secondary | ICD-10-CM | POA: Diagnosis not present

## 2022-03-04 DIAGNOSIS — N2889 Other specified disorders of kidney and ureter: Secondary | ICD-10-CM | POA: Diagnosis not present

## 2022-03-08 DIAGNOSIS — N2889 Other specified disorders of kidney and ureter: Secondary | ICD-10-CM | POA: Diagnosis not present

## 2022-03-08 DIAGNOSIS — R319 Hematuria, unspecified: Secondary | ICD-10-CM | POA: Diagnosis not present

## 2022-03-09 DIAGNOSIS — R319 Hematuria, unspecified: Secondary | ICD-10-CM | POA: Diagnosis not present

## 2022-03-09 DIAGNOSIS — E876 Hypokalemia: Secondary | ICD-10-CM | POA: Diagnosis not present

## 2022-03-09 DIAGNOSIS — N179 Acute kidney failure, unspecified: Secondary | ICD-10-CM | POA: Diagnosis not present

## 2022-03-09 DIAGNOSIS — D630 Anemia in neoplastic disease: Secondary | ICD-10-CM | POA: Diagnosis not present

## 2022-03-09 DIAGNOSIS — N2889 Other specified disorders of kidney and ureter: Secondary | ICD-10-CM | POA: Diagnosis not present

## 2022-03-13 DIAGNOSIS — N2889 Other specified disorders of kidney and ureter: Secondary | ICD-10-CM | POA: Diagnosis not present

## 2022-03-13 DIAGNOSIS — R310 Gross hematuria: Secondary | ICD-10-CM | POA: Diagnosis not present

## 2022-03-13 DIAGNOSIS — I10 Essential (primary) hypertension: Secondary | ICD-10-CM | POA: Diagnosis not present

## 2022-03-13 DIAGNOSIS — I7121 Aneurysm of the ascending aorta, without rupture: Secondary | ICD-10-CM | POA: Diagnosis not present

## 2022-03-13 DIAGNOSIS — K7460 Unspecified cirrhosis of liver: Secondary | ICD-10-CM | POA: Diagnosis not present

## 2022-03-15 DIAGNOSIS — R600 Localized edema: Secondary | ICD-10-CM | POA: Diagnosis not present

## 2022-03-15 DIAGNOSIS — I8500 Esophageal varices without bleeding: Secondary | ICD-10-CM | POA: Diagnosis not present

## 2022-03-15 DIAGNOSIS — D649 Anemia, unspecified: Secondary | ICD-10-CM | POA: Diagnosis not present

## 2022-03-15 DIAGNOSIS — D62 Acute posthemorrhagic anemia: Secondary | ICD-10-CM | POA: Diagnosis not present

## 2022-03-15 DIAGNOSIS — K7460 Unspecified cirrhosis of liver: Secondary | ICD-10-CM | POA: Diagnosis not present

## 2022-03-15 DIAGNOSIS — K7469 Other cirrhosis of liver: Secondary | ICD-10-CM | POA: Diagnosis not present

## 2022-04-03 DIAGNOSIS — R600 Localized edema: Secondary | ICD-10-CM | POA: Diagnosis not present

## 2022-04-05 DIAGNOSIS — I10 Essential (primary) hypertension: Secondary | ICD-10-CM | POA: Diagnosis not present

## 2022-04-05 DIAGNOSIS — R609 Edema, unspecified: Secondary | ICD-10-CM | POA: Diagnosis not present

## 2022-04-09 DIAGNOSIS — N2889 Other specified disorders of kidney and ureter: Secondary | ICD-10-CM | POA: Diagnosis not present

## 2022-04-09 DIAGNOSIS — R310 Gross hematuria: Secondary | ICD-10-CM | POA: Diagnosis not present

## 2022-04-25 DIAGNOSIS — I10 Essential (primary) hypertension: Secondary | ICD-10-CM | POA: Diagnosis not present

## 2022-04-25 DIAGNOSIS — R609 Edema, unspecified: Secondary | ICD-10-CM | POA: Diagnosis not present

## 2022-04-27 DIAGNOSIS — I8510 Secondary esophageal varices without bleeding: Secondary | ICD-10-CM | POA: Diagnosis not present

## 2022-04-27 DIAGNOSIS — I10 Essential (primary) hypertension: Secondary | ICD-10-CM | POA: Diagnosis not present

## 2022-04-27 DIAGNOSIS — E785 Hyperlipidemia, unspecified: Secondary | ICD-10-CM | POA: Diagnosis not present

## 2022-04-27 DIAGNOSIS — R600 Localized edema: Secondary | ICD-10-CM | POA: Diagnosis not present

## 2022-04-27 DIAGNOSIS — K766 Portal hypertension: Secondary | ICD-10-CM | POA: Diagnosis not present

## 2022-04-27 DIAGNOSIS — K7460 Unspecified cirrhosis of liver: Secondary | ICD-10-CM | POA: Diagnosis not present

## 2022-04-27 DIAGNOSIS — K3189 Other diseases of stomach and duodenum: Secondary | ICD-10-CM | POA: Diagnosis not present

## 2022-04-27 DIAGNOSIS — D649 Anemia, unspecified: Secondary | ICD-10-CM | POA: Diagnosis not present

## 2022-04-27 DIAGNOSIS — I2510 Atherosclerotic heart disease of native coronary artery without angina pectoris: Secondary | ICD-10-CM | POA: Diagnosis not present

## 2022-04-27 DIAGNOSIS — Z79899 Other long term (current) drug therapy: Secondary | ICD-10-CM | POA: Diagnosis not present

## 2022-05-29 DIAGNOSIS — N2889 Other specified disorders of kidney and ureter: Secondary | ICD-10-CM | POA: Diagnosis not present

## 2022-05-29 DIAGNOSIS — R188 Other ascites: Secondary | ICD-10-CM | POA: Diagnosis not present

## 2022-05-29 DIAGNOSIS — K7460 Unspecified cirrhosis of liver: Secondary | ICD-10-CM | POA: Diagnosis not present

## 2022-05-29 DIAGNOSIS — I10 Essential (primary) hypertension: Secondary | ICD-10-CM | POA: Diagnosis not present

## 2022-05-29 DIAGNOSIS — N1831 Chronic kidney disease, stage 3a: Secondary | ICD-10-CM | POA: Diagnosis not present

## 2022-06-11 DIAGNOSIS — I10 Essential (primary) hypertension: Secondary | ICD-10-CM | POA: Diagnosis not present

## 2022-06-11 DIAGNOSIS — N2889 Other specified disorders of kidney and ureter: Secondary | ICD-10-CM | POA: Diagnosis not present

## 2022-06-11 DIAGNOSIS — I2510 Atherosclerotic heart disease of native coronary artery without angina pectoris: Secondary | ICD-10-CM | POA: Diagnosis not present

## 2022-06-11 DIAGNOSIS — I7121 Aneurysm of the ascending aorta, without rupture: Secondary | ICD-10-CM | POA: Diagnosis not present

## 2022-06-11 DIAGNOSIS — E785 Hyperlipidemia, unspecified: Secondary | ICD-10-CM | POA: Diagnosis not present

## 2022-06-12 DIAGNOSIS — N184 Chronic kidney disease, stage 4 (severe): Secondary | ICD-10-CM | POA: Diagnosis not present

## 2022-06-12 DIAGNOSIS — N289 Disorder of kidney and ureter, unspecified: Secondary | ICD-10-CM | POA: Diagnosis not present

## 2022-06-12 DIAGNOSIS — I129 Hypertensive chronic kidney disease with stage 1 through stage 4 chronic kidney disease, or unspecified chronic kidney disease: Secondary | ICD-10-CM | POA: Diagnosis not present

## 2022-06-12 DIAGNOSIS — E1122 Type 2 diabetes mellitus with diabetic chronic kidney disease: Secondary | ICD-10-CM | POA: Diagnosis not present

## 2022-06-12 DIAGNOSIS — N2889 Other specified disorders of kidney and ureter: Secondary | ICD-10-CM | POA: Diagnosis not present

## 2022-07-10 DIAGNOSIS — K7460 Unspecified cirrhosis of liver: Secondary | ICD-10-CM | POA: Diagnosis not present

## 2022-07-10 DIAGNOSIS — C642 Malignant neoplasm of left kidney, except renal pelvis: Secondary | ICD-10-CM | POA: Diagnosis not present

## 2022-07-10 DIAGNOSIS — I10 Essential (primary) hypertension: Secondary | ICD-10-CM | POA: Diagnosis not present

## 2022-07-10 DIAGNOSIS — R188 Other ascites: Secondary | ICD-10-CM | POA: Diagnosis not present

## 2022-07-10 DIAGNOSIS — I509 Heart failure, unspecified: Secondary | ICD-10-CM | POA: Diagnosis not present

## 2022-07-19 DIAGNOSIS — I10 Essential (primary) hypertension: Secondary | ICD-10-CM | POA: Diagnosis not present

## 2022-07-19 DIAGNOSIS — Z01812 Encounter for preprocedural laboratory examination: Secondary | ICD-10-CM | POA: Diagnosis not present

## 2022-07-19 DIAGNOSIS — I7121 Aneurysm of the ascending aorta, without rupture: Secondary | ICD-10-CM | POA: Diagnosis not present

## 2022-07-19 DIAGNOSIS — I2510 Atherosclerotic heart disease of native coronary artery without angina pectoris: Secondary | ICD-10-CM | POA: Diagnosis not present

## 2022-07-19 DIAGNOSIS — C642 Malignant neoplasm of left kidney, except renal pelvis: Secondary | ICD-10-CM | POA: Diagnosis not present

## 2022-07-19 DIAGNOSIS — Z01810 Encounter for preprocedural cardiovascular examination: Secondary | ICD-10-CM | POA: Diagnosis not present

## 2022-07-19 DIAGNOSIS — R188 Other ascites: Secondary | ICD-10-CM | POA: Diagnosis not present

## 2022-07-19 DIAGNOSIS — K7460 Unspecified cirrhosis of liver: Secondary | ICD-10-CM | POA: Diagnosis not present

## 2022-07-19 DIAGNOSIS — Z01818 Encounter for other preprocedural examination: Secondary | ICD-10-CM | POA: Diagnosis not present

## 2022-07-19 DIAGNOSIS — D696 Thrombocytopenia, unspecified: Secondary | ICD-10-CM | POA: Diagnosis not present

## 2022-08-02 DIAGNOSIS — K7460 Unspecified cirrhosis of liver: Secondary | ICD-10-CM | POA: Diagnosis not present

## 2022-08-07 DIAGNOSIS — C642 Malignant neoplasm of left kidney, except renal pelvis: Secondary | ICD-10-CM | POA: Diagnosis not present
